# Patient Record
Sex: Female | Born: 1970 | Race: White | Hispanic: No | Marital: Single | Smoking: Never smoker
Health system: Southern US, Community
[De-identification: ages and names within clinical notes are randomized; demographics above are authoritative.]

---

## 2015-04-26 ENCOUNTER — Emergency Department (HOSPITAL_COMMUNITY): Payer: Self-pay

## 2015-04-26 ENCOUNTER — Encounter (HOSPITAL_COMMUNITY): Payer: Self-pay | Admitting: *Deleted

## 2015-04-26 ENCOUNTER — Emergency Department (HOSPITAL_COMMUNITY)
Admission: EM | Admit: 2015-04-26 | Discharge: 2015-04-26 | Disposition: A | Discharge status: Home / Self Care | Payer: Self-pay | Attending: Emergency Medicine | Admitting: Emergency Medicine

## 2015-04-26 DIAGNOSIS — R1031 Right lower quadrant pain: Secondary | ICD-10-CM | POA: Insufficient documentation

## 2015-04-26 DIAGNOSIS — Z79899 Other long term (current) drug therapy: Secondary | ICD-10-CM | POA: Insufficient documentation

## 2015-04-26 LAB — URINALYSIS, ROUTINE W REFLEX MICROSCOPIC
BILIRUBIN URINE: NEGATIVE
Glucose, UA: NEGATIVE mg/dL
Hgb urine dipstick: NEGATIVE
Ketones, ur: NEGATIVE mg/dL
NITRITE: NEGATIVE
PH: 6 (ref 5.0–8.0)
Protein, ur: NEGATIVE mg/dL
SPECIFIC GRAVITY, URINE: 1.008 (ref 1.005–1.030)

## 2015-04-26 LAB — COMPREHENSIVE METABOLIC PANEL
ALBUMIN: 3.4 g/dL — AB (ref 3.5–5.0)
ALT: 12 U/L — ABNORMAL LOW (ref 14–54)
ANION GAP: 11 (ref 5–15)
AST: 19 U/L (ref 15–41)
Alkaline Phosphatase: 63 U/L (ref 38–126)
BUN: 12 mg/dL (ref 6–20)
CO2: 25 mmol/L (ref 22–32)
Calcium: 9 mg/dL (ref 8.9–10.3)
Chloride: 104 mmol/L (ref 101–111)
Creatinine, Ser: 0.78 mg/dL (ref 0.44–1.00)
GFR calc non Af Amer: >60 mL/min (ref >60)
GLUCOSE: 102 mg/dL — AB (ref 65–99)
POTASSIUM: 4 mmol/L (ref 3.5–5.1)
SODIUM: 140 mmol/L (ref 135–145)
TOTAL PROTEIN: 6.2 g/dL — AB (ref 6.5–8.1)
Total Bilirubin: 0.7 mg/dL (ref 0.3–1.2)

## 2015-04-26 LAB — CBC
HEMATOCRIT: 35.7 % — AB (ref 36.0–46.0)
HEMOGLOBIN: 11.2 g/dL — AB (ref 12.0–15.0)
MCH: 26.9 pg (ref 26.0–34.0)
MCHC: 31.4 g/dL (ref 30.0–36.0)
MCV: 85.8 fL (ref 78.0–100.0)
Platelets: 386 10*3/uL (ref 150–400)
RBC: 4.16 MIL/uL (ref 3.87–5.11)
RDW: 14 % (ref 11.5–15.5)
WBC: 11.8 10*3/uL — ABNORMAL HIGH (ref 4.0–10.5)

## 2015-04-26 LAB — URINE MICROSCOPIC-ADD ON

## 2015-04-26 LAB — LIPASE, BLOOD: Lipase: 28 U/L (ref 11–51)

## 2015-04-26 MED ORDER — TRAMADOL HCL 50 MG PO TABS: 50.0000 mg | ORAL_TABLET | Freq: Four times a day (QID) | ORAL | Status: AC | PRN

## 2015-04-26 MED ORDER — DICYCLOMINE HCL 20 MG PO TABS: 20.0000 mg | ORAL_TABLET | Freq: Two times a day (BID) | ORAL | Status: AC

## 2015-04-26 MED ORDER — KETOROLAC TROMETHAMINE 30 MG/ML IJ SOLN
30.0000 mg | Freq: Once | INTRAMUSCULAR | Status: AC
  Administered 2015-04-26: 30 mg via INTRAVENOUS
  Filled 2015-04-26: qty 1

## 2015-04-26 MED ORDER — IOPAMIDOL (ISOVUE-300) INJECTION 61%
INTRAVENOUS | Status: AC
  Administered 2015-04-26: 100 mL
  Filled 2015-04-26: qty 100

## 2015-04-26 NOTE — Discharge Instructions (Signed)

## 2015-04-26 NOTE — ED Notes (Signed)
Pt unable to urinate at this time. Will attempt to collect urine next round.

## 2015-04-26 NOTE — ED Provider Notes (Signed)
CSN: [id number]     Arrival date & time 04/26/15  0023 History  By signing my name below, I, Melinda Williams, attest that this documentation has been prepared under the direction and in the presence of Melinda Crease, MD.   Electronically Signed: [name], ED Scribe. 04/26/2015. 4:28 AM  Chief Complaint  Patient presents with  . Abdominal Pain    The history is provided by the patient. No language interpreter was used.   HPI Comments: Melinda Williams is a 45 y.o. female who presents to the Emergency Department complaining of gradual onset, intermittent, RLQ abdominal pain, ongoing for several months, worsening two days ago. No other associated symptoms noted. No worsening or alleviating factors noted. Pt denies nausea, vomiting, diarrhea, or any other pertinent symptoms.   History reviewed. No pertinent past medical history. History reviewed. No pertinent past surgical history. No family history on file. Social History  Substance Use Topics  . Smoking status: Never Smoker   . Smokeless tobacco: None  . Alcohol Use: Yes   OB History    No data available     Review of Systems  Constitutional: Negative for fever.  Gastrointestinal: Positive for abdominal pain. Negative for nausea, vomiting and diarrhea.  All other systems reviewed and are negative.   Allergies  Norethindrone; Codeine; and Vicodin  Home Medications   Prior to Admission medications   Medication Sig Start Date End Date Taking? Authorizing Provider  acyclovir (ZOVIRAX) 200 MG capsule Take 200 mg by mouth as needed (for break out).  04/10/15  Yes Historical Provider, MD  albuterol (PROVENTIL) (2.5 MG/3ML) 0.083% nebulizer solution Take 2.5 mg by nebulization every 6 (six) hours as needed for wheezing or shortness of breath.   Yes Historical Provider, MD  ALPRAZolam [name]) 0.5 MG tablet Take 0.5 mg by mouth 3 (three) times daily. 04/02/15  Yes Historical Provider, MD  furosemide (LASIX) 40 MG tablet  Take 40 mg by mouth 2 (two) times daily. 04/10/15  Yes Historical Provider, MD  gabapentin (NEURONTIN) 300 MG capsule Take 900 mg by mouth 3 (three) times daily. 04/10/15  Yes Historical Provider, MD  hydrOXYzine (VISTARIL) 25 MG capsule Take 25 mg by mouth every 6 (six) hours as needed for itching.  04/12/15  Yes Historical Provider, MD  levETIRAcetam (KEPPRA) 500 MG tablet Take 500 mg by mouth 2 (two) times daily. 04/12/15  Yes Historical Provider, MD  loratadine (CLARITIN) 10 MG tablet Take 10 mg by mouth daily.   Yes Historical Provider, MD  nabumetone (RELAFEN) 750 MG tablet Take 750 mg by mouth 2 (two) times daily. 04/10/15  Yes Historical Provider, MD  omeprazole (PRILOSEC) 20 MG capsule Take 20 mg by mouth daily. 04/10/15  Yes Historical Provider, MD  risperiDONE (RISPERDAL) 2 MG tablet Take 4 mg by mouth at bedtime. 04/10/15  Yes Historical Provider, MD  SYMBICORT 160-4.5 MCG/ACT inhaler Inhale 1 puff into the lungs 2 (two) times daily.  04/10/15  Yes Historical Provider, MD  tiZANidine (ZANAFLEX) 4 MG tablet Take 4 mg by mouth every 8 (eight) hours as needed for muscle spasms.  04/10/15  Yes Historical Provider, MD  traZODone (DESYREL) 50 MG tablet Take 50-100 mg by mouth at bedtime as needed for sleep.  04/10/15  Yes Historical Provider, MD   BP 144/100 mmHg  Pulse 85  Temp(Src) 98 F (36.7 C) (Oral)  Resp 20  Ht 5' 5.5" (1.664 m)  Wt 283 lb 9 oz (128.623 kg)  BMI 46.45 kg/m2  SpO2 96%  Physical Exam  Constitutional: She is oriented to person, place, and time. She appears well-developed and well-nourished. No distress.  HENT:  Head: Normocephalic and atraumatic.  Right Ear: Hearing normal.  Left Ear: Hearing normal.  Nose: Nose normal.  Mouth/Throat: Oropharynx is clear and moist and mucous membranes are normal.  Eyes: Conjunctivae and EOM are normal. Pupils are equal, round, and reactive to light.  Neck: Normal range of motion. Neck supple.  Cardiovascular: Regular rhythm, S1 normal  and S2 normal.  Exam reveals no gallop and no friction rub.   No murmur heard. Pulmonary/Chest: Effort normal and breath sounds normal. No respiratory distress. She exhibits no tenderness.  Abdominal: Soft. Normal appearance and bowel sounds are normal. There is no hepatosplenomegaly. There is tenderness. There is no rebound, no guarding, no tenderness at McBurney's point and negative Murphy's sign. No hernia.  Mild TTP right infraumbilical region.   Musculoskeletal: Normal range of motion.  Neurological: She is alert and oriented to person, place, and time. She has normal strength. No cranial nerve deficit or sensory deficit. Coordination normal. GCS eye subscore is 4. GCS verbal subscore is 5. GCS motor subscore is 6.  Skin: Skin is warm, dry and intact. No rash noted. No cyanosis.  Psychiatric: She has a normal mood and affect. Her speech is normal and behavior is normal. Thought content normal.  Nursing note and vitals reviewed.   ED Course  Procedures (including critical care time) DIAGNOSTIC STUDIES: Oxygen Saturation is 96% on RA, normal by my interpretation.    COORDINATION OF CARE: 2:08 AM-Discussed treatment plan which includes lipase, CMP, CBC, and urinalysis with pt at bedside and pt agreed to plan.    Labs Review Labs Reviewed  COMPREHENSIVE METABOLIC PANEL - Abnormal; Notable for the following:    Glucose, Bld 102 (*)    Total Protein 6.2 (*)    Albumin 3.4 (*)    ALT 12 (*)    All other components within normal limits  CBC - Abnormal; Notable for the following:    WBC 11.8 (*)    Hemoglobin 11.2 (*)    HCT 35.7 (*)    All other components within normal limits  LIPASE, BLOOD  URINALYSIS, ROUTINE W REFLEX MICROSCOPIC (NOT AT [facility])    Imaging Review Ct Abdomen Pelvis W Contrast  04/26/2015  CLINICAL DATA:  Acute onset of right lower quadrant abdominal pain. Initial encounter. EXAM: CT ABDOMEN AND PELVIS WITH CONTRAST TECHNIQUE: Multidetector CT imaging of the  abdomen and pelvis was performed using the standard protocol following bolus administration of intravenous contrast. CONTRAST:  100mL ISOVUE-300 IOPAMIDOL (ISOVUE-300) INJECTION 61% COMPARISON:  None. FINDINGS: Trace bilateral pleural fluid is noted. The liver and spleen are unremarkable in appearance. The gallbladder is is decompressed and within normal limits. The pancreas and adrenal glands are unremarkable. The kidneys are unremarkable in appearance. There is no evidence of hydronephrosis. No renal or ureteral stones are seen. No perinephric stranding is appreciated. No free fluid is identified. The small bowel is unremarkable in appearance. The stomach is within normal limits. No acute vascular abnormalities are seen. The appendix is normal in caliber, without evidence of appendicitis. The colon is unremarkable in appearance. The bladder is moderately distended and grossly unremarkable. The uterus is unremarkable in appearance. The patient is status post bilateral tubal ligation. The ovaries are relatively symmetric. No suspicious adnexal masses are seen. No inguinal lymphadenopathy is seen. No acute osseous abnormalities are identified. A severe chronic compression deformity is noted at T12,  and a mild chronic compression deformity is noted at T11. Endplate sclerotic change and vacuum phenomenon are noted at the lower thoracic spine and at L4-L5. IMPRESSION: 1. No acute abnormality seen within the abdomen or pelvis. 2. Trace bilateral pleural fluid noted. 3. 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 Degenerative change at the lower thoracic spine and at L4-L5. Electronically Signed   By: Roanna Raider M.D.   On: 04/26/2015 04:18   I have personally reviewed and evaluated these lab results as part of my medical decision-making.   EKG Interpretation None      MDM   Final diagnoses:  Right lower quadrant abdominal pain   Patient presents to the ER  for evaluation of abdominal pain. Patient reports that she has had 2 days of pain in the right side of her abdomen. She does, however, report that this has been a somewhat chronic problem ongoing for more than 6 months. She has been seen by her doctor for it in the past, was told she might have a hernia.  Pain is not at McBurney's point, but rather just below the umbilicus off to the right of midline. There is no guarding or rebound noted.Bloodwork reveals very mild leukocytosis, otherwise no abnormalities. CT scan was performed and does not show any acute abnormality.   At this point I do not feel the patient has any significant abnormality that would explain the symptoms. Pain is not in the low pelvis but in the area adjacent to the umbilicus, doubt that there is gynecologic origin. Patient reports ongoing issues with chronic pain for the last 6 months and I will therefore refer her to gastroenterology for a recheck. She will be treated empirically.  I personally performed the services described in this documentation, which was scribed in my presence. The recorded information has been reviewed and is accurate.    Melinda Crease, MD 04/26/15 [phone]

## 2015-04-26 NOTE — ED Notes (Signed)
Pt taken to CT.

## 2015-04-26 NOTE — ED Notes (Signed)
The pt has had  rlq pain for 2 days no n v or diarrhea.  lmp none

## 2017-06-14 IMAGING — CT CT ABD-PELV W/ CM
2 of 5 series · 17 of 46 positions shown, 19 images · IV contrast (Omni 300)
Comparison: None.

CLINICAL DATA: Acute onset of right lower quadrant abdominal pain.
Initial encounter.

EXAM:
CT ABDOMEN AND PELVIS WITH CONTRAST
TECHNIQUE: Multidetector CT imaging of the abdomen and pelvis was performed
using the standard protocol following bolus administration of
intravenous contrast.
CONTRAST:  100mL T6LQTQ-TWW IOPAMIDOL (T6LQTQ-TWW) INJECTION 61%

[Series 2: a/p w/ 5mm · axial · 0.84mm/px · z∈[-486,-76]mm · 14 of 92 slices shown, 16 images]
[im 5/92  soft-tissue]
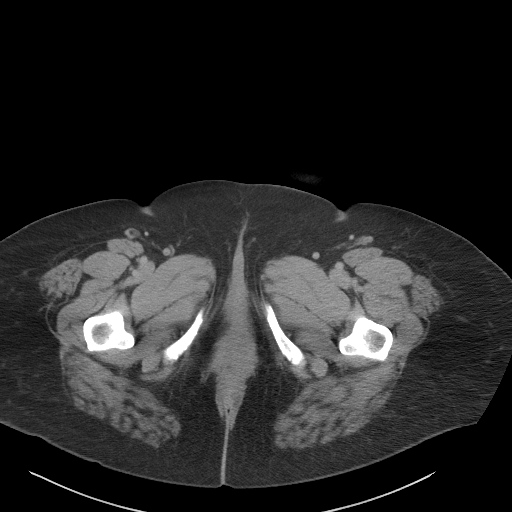
[im 5/92  bone]
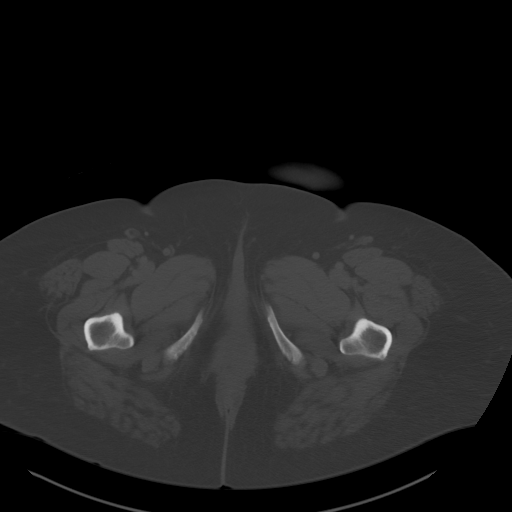
[im 10/92  soft-tissue]
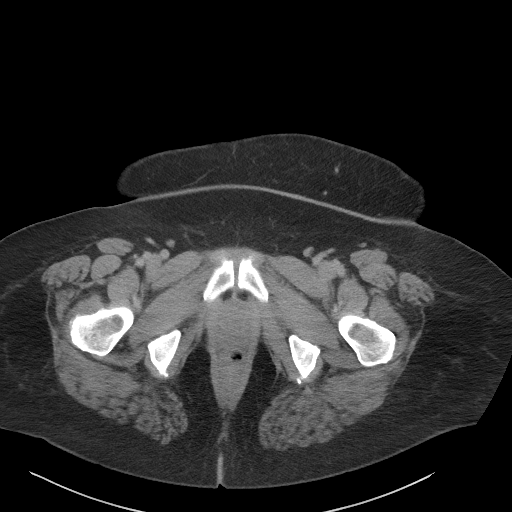
[im 20/92  soft-tissue]
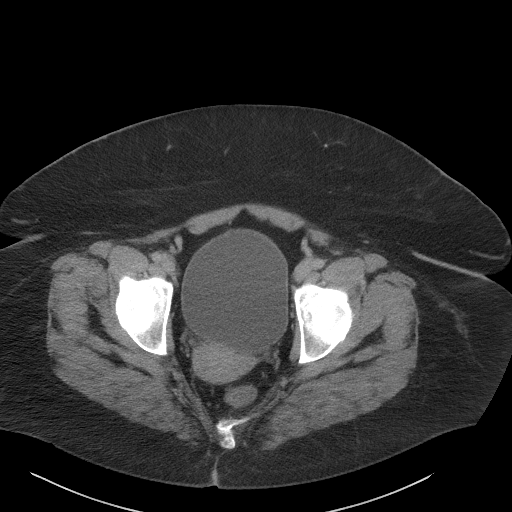
[im 24/92  soft-tissue]
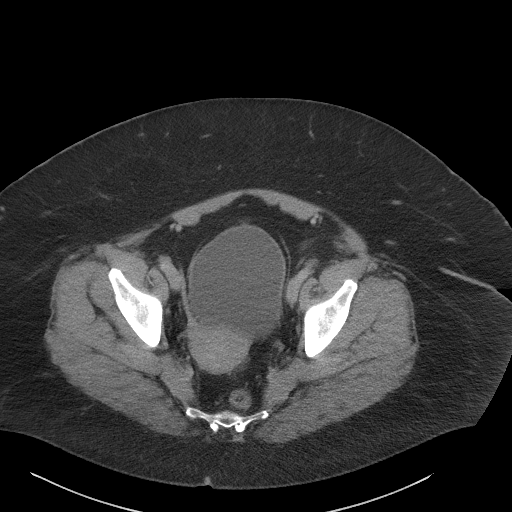
[im 29/92  soft-tissue]
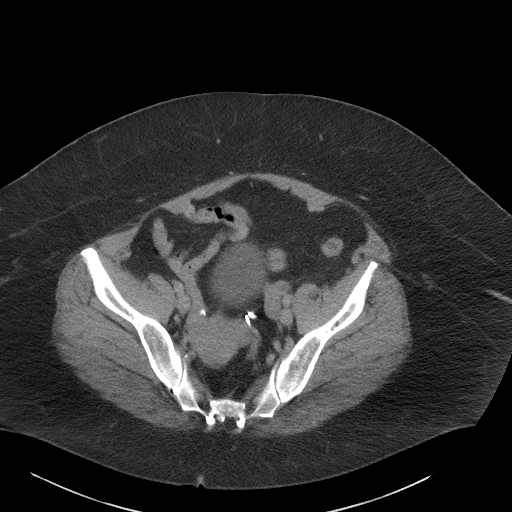
[im 39/92  soft-tissue]
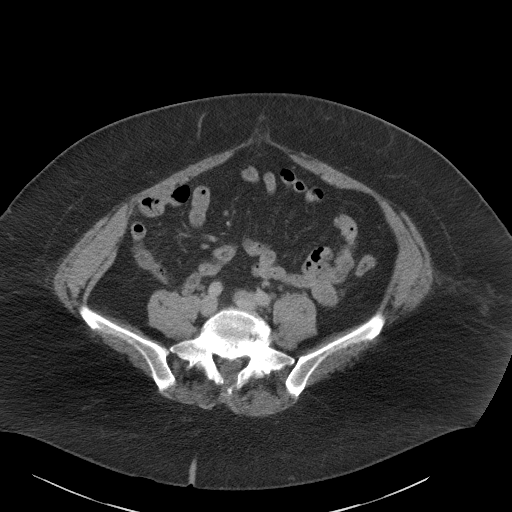
[im 44/92  soft-tissue]
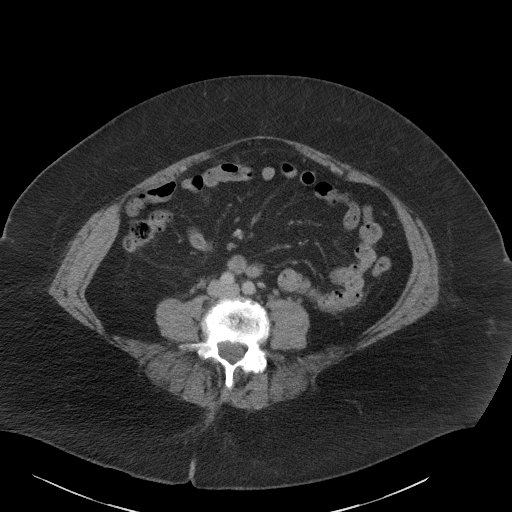
[im 48/92  soft-tissue]
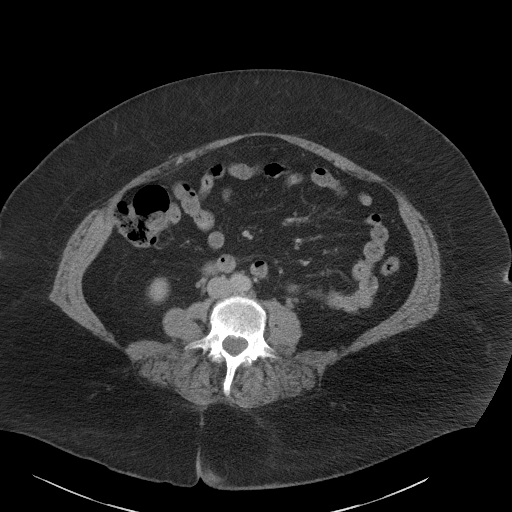
[im 53/92  soft-tissue]
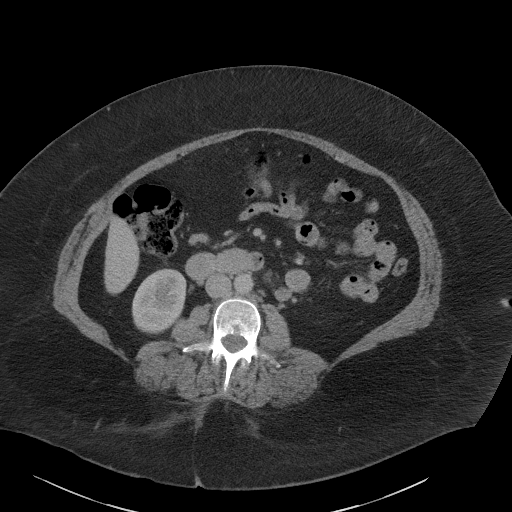
[im 53/92  bone]
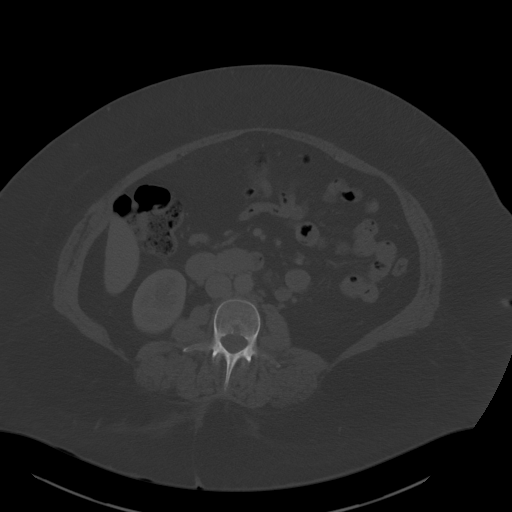
[im 63/92  soft-tissue]
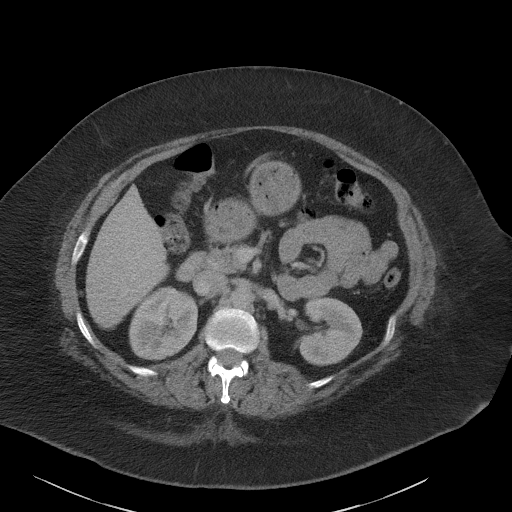
[im 68/92  soft-tissue]
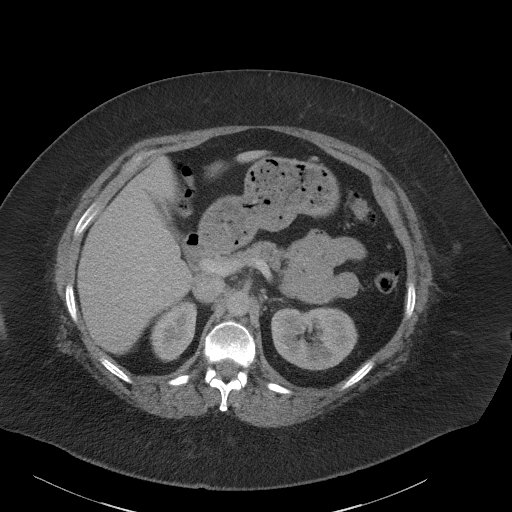
[im 72/92  soft-tissue]
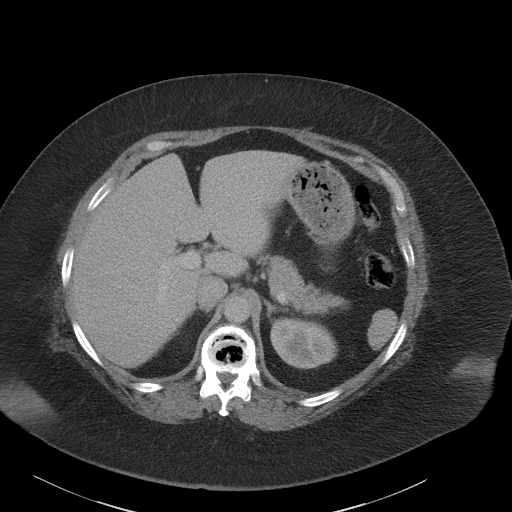
[im 82/92  soft-tissue]
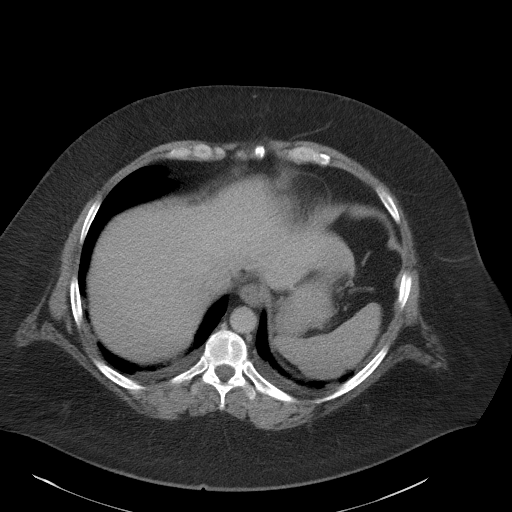
[im 87/92  soft-tissue]
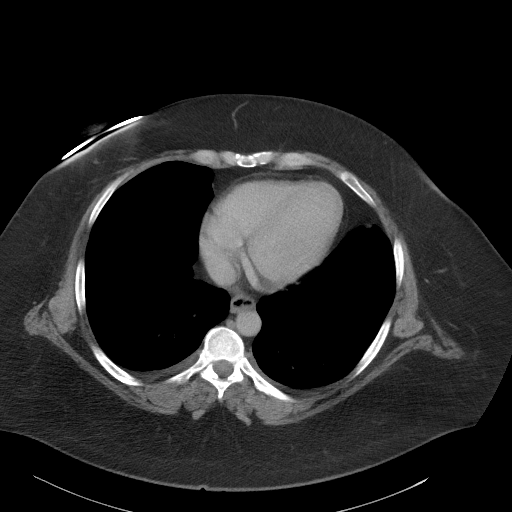

[Series 5: a/p w/ cor · coronal · 0.89mm/px · 3 of 162 slices shown]
[im 54/162  soft-tissue]
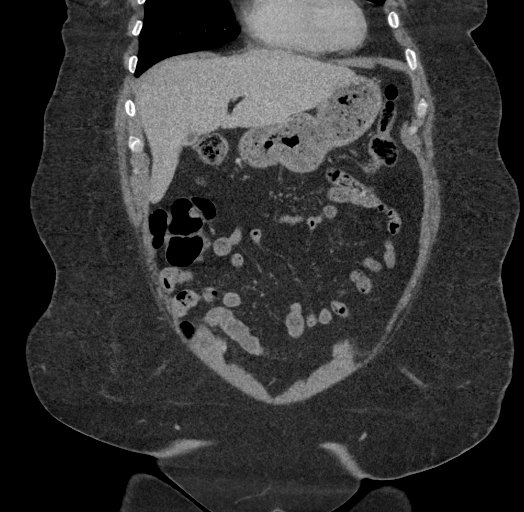
[im 72/162  soft-tissue]
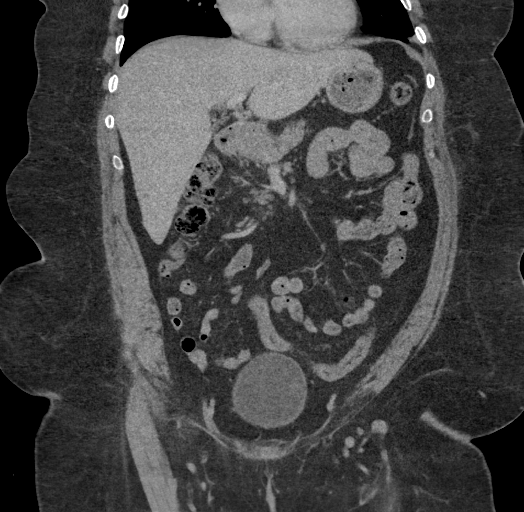
[im 90/162  soft-tissue]
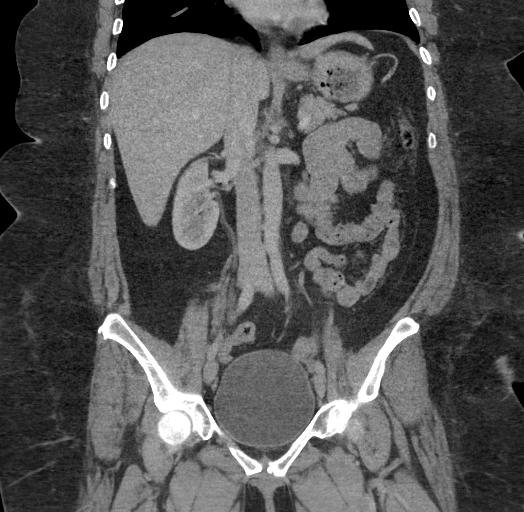

[17 of 46 positions shown; findings below may reference images not displayed]

FINDINGS: Trace bilateral pleural fluid is noted.

The liver and spleen are unremarkable in appearance. The gallbladder
is is decompressed and within normal limits. The pancreas and
adrenal glands are unremarkable.

The kidneys are unremarkable in appearance. There is no evidence of
hydronephrosis. No renal or ureteral stones are seen. No perinephric
stranding is appreciated.

No free fluid is identified. The small bowel is unremarkable in
appearance. The stomach is within normal limits. No acute vascular
abnormalities are seen.

The appendix is normal in caliber, without evidence of appendicitis.
The colon is unremarkable in appearance.

The bladder is moderately distended and grossly unremarkable. The
uterus is unremarkable in appearance. The patient is status post
bilateral tubal ligation. The ovaries are relatively symmetric. No
suspicious adnexal masses are seen. No inguinal lymphadenopathy is
seen.

No acute osseous abnormalities are identified. A severe chronic
compression deformity is noted at T12, and a mild chronic
compression deformity is noted at T11. Endplate sclerotic change and
vacuum phenomenon are noted at the lower thoracic spine and at
L4-L5.
IMPRESSION: 1. No acute abnormality seen within the abdomen or pelvis.
2. Trace bilateral pleural fluid noted.
3. Severe chronic compression deformity at T12, and mild chronic
compression deformity at T11. Degenerative change at the lower
thoracic spine and at L4-L5.
